# Patient Record
Sex: Female | Born: 1975 | Race: White | Hispanic: No | Marital: Married | Smoking: Never smoker
Health system: Southern US, Community
[De-identification: ages and names within clinical notes are randomized; demographics above are authoritative.]

---

## 2018-06-19 ENCOUNTER — Encounter: Payer: Self-pay | Admitting: Podiatry

## 2018-06-19 ENCOUNTER — Ambulatory Visit: Payer: BLUE CROSS/BLUE SHIELD | Admitting: Podiatry

## 2018-06-19 VITALS — BP 124/75 | HR 75 | Resp 16

## 2018-06-19 DIAGNOSIS — L600 Ingrowing nail: Secondary | ICD-10-CM | POA: Diagnosis not present

## 2018-06-19 MED ORDER — NEOMYCIN-POLYMYXIN-HC 1 % OT SOLN: OTIC | 1 refills | Status: AC

## 2018-06-19 NOTE — Patient Instructions (Signed)

## 2018-06-19 NOTE — Progress Notes (Signed)
  Subjective:  Patient ID: Shannon Rivera, female    DOB: [birth year],  MRN: [id number] HPI Chief Complaint  Patient presents with  . Toe Pain    Hallux left - medial border, tedner x 6 months, red and swollen, tried trimming-just moved from [place]  . 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 New Patient (Initial Visit)    42 y.o. female presents with the above complaint.   ROS: Denies fever chills nausea vomiting muscle aches pains calf pain back pain chest pain shortness of breath.  No past medical history on file.   Current Outpatient Medications:  .  ALBUTEROL IN, Inhale into the lungs., Disp: , Rfl:  .  Fexofenadine HCl (ALLEGRA PO), Take by mouth., Disp: , Rfl:  .  montelukast (SINGULAIR) 10 MG tablet, Take 10 mg by mouth at bedtime., Disp: , Rfl:  .  NEOMYCIN-POLYMYXIN-HYDROCORTISONE (CORTISPORIN) 1 % SOLN OTIC solution, Apply 1-2 drops to toe BID after soaking, Disp: 10 mL, Rfl: 1  Allergies  Allergen Reactions  . Sulfa Antibiotics    Review of Systems Objective:   Vitals:   06/19/18 1021  BP: 124/75  Pulse: 75  Resp: 16    General: Well developed, nourished, in no acute distress, alert and oriented x3   Dermatological: Skin is warm, dry and supple bilateral. Nails x 10 are well maintained; remaining integument appears unremarkable at this time. There are no open sores, no preulcerative lesions, no rash or signs of infection present.  Sharp incurvated nail margin tibial border hallux left mild erythema minimal pain on palpation.  No purulence no malodor.  Vascular: Dorsalis Pedis artery and Posterior Tibial artery pedal pulses are 2/4 bilateral with immedate capillary fill time. Pedal hair growth present. No varicosities and no lower extremity edema present bilateral.   Neruologic: Grossly intact via light touch bilateral. Vibratory intact via tuning fork bilateral. Protective threshold with Semmes Wienstein monofilament intact to all pedal sites bilateral.  Patellar and Achilles deep tendon reflexes 2+ bilateral. No Babinski or clonus noted bilateral.   Musculoskeletal: No gross boney pedal deformities bilateral. No pain, crepitus, or limitation noted with foot and ankle range of motion bilateral. Muscular strength 5/5 in all groups tested bilateral.  Gait: Unassisted, Nonantalgic.    Radiographs:  None taken  Assessment & Plan:   Assessment: Ingrown toenail tibial border hallux left.   Plan: Discussed etiology pathology conservative surgical therapies at this point in time chemical matricectomy was performed under local anesthesia was administered to the hallux left.  She tolerated procedure well without complications was provided with both oral and written home-going instruction for care and soaking of the toe as well as prescription for Cortisporin Otic.  Follow-up with her in 2 weeks she will call with questions or concerns.     Shannon Rivera T. [place], [place]

## 2018-07-03 ENCOUNTER — Ambulatory Visit (INDEPENDENT_AMBULATORY_CARE_PROVIDER_SITE_OTHER): Payer: Self-pay

## 2018-07-03 DIAGNOSIS — L600 Ingrowing nail: Secondary | ICD-10-CM

## 2018-07-03 NOTE — Progress Notes (Signed)
Patient is here today for follow-up appointment, recent procedure performed on 06/19/2018, removal of ingrown toenail left hallux tibial border.  She states that the area is not hurting or not causing her any complications.  No redness, no erythema, no swelling, no drainage, no other signs and symptoms of infection.  The area is scabbed over and is healing well.  Discussed signs and symptoms of infection.  Verbal and written instructions were given to the patient.  She is to follow-up as needed with any acute symptom changes.

## 2018-07-03 NOTE — Patient Instructions (Signed)

## 2018-10-19 ENCOUNTER — Other Ambulatory Visit: Payer: Self-pay | Admitting: Family Medicine

## 2018-10-19 DIAGNOSIS — Z1231 Encounter for screening mammogram for malignant neoplasm of breast: Secondary | ICD-10-CM

## 2018-12-10 ENCOUNTER — Other Ambulatory Visit: Payer: Self-pay | Admitting: Family Medicine

## 2019-04-09 ENCOUNTER — Ambulatory Visit: Payer: Self-pay

## 2019-04-15 ENCOUNTER — Ambulatory Visit: Payer: BLUE CROSS/BLUE SHIELD

## 2019-05-22 ENCOUNTER — Ambulatory Visit: Payer: Self-pay

## 2019-06-06 ENCOUNTER — Other Ambulatory Visit: Payer: Self-pay | Admitting: Radiology

## 2019-06-06 DIAGNOSIS — R9230 Dense breasts, unspecified: Secondary | ICD-10-CM

## 2019-06-06 DIAGNOSIS — R922 Inconclusive mammogram: Secondary | ICD-10-CM

## 2019-08-05 ENCOUNTER — Ambulatory Visit
Admission: RE | Admit: 2019-08-05 | Discharge: 2019-08-05 | Disposition: A | Discharge status: Home / Self Care | Payer: No Typology Code available for payment source | Source: Ambulatory Visit | Attending: Radiology | Admitting: Radiology

## 2019-08-05 DIAGNOSIS — R9230 Dense breasts, unspecified: Secondary | ICD-10-CM

## 2019-08-05 DIAGNOSIS — R922 Inconclusive mammogram: Secondary | ICD-10-CM

## 2019-08-05 MED ORDER — GADOBUTROL 1 MMOL/ML IV SOLN
8.0000 mL | Freq: Once | INTRAVENOUS | Status: AC | PRN
  Administered 2019-08-05: 8 mL via INTRAVENOUS

## 2020-09-25 ENCOUNTER — Other Ambulatory Visit: Payer: Self-pay

## 2020-09-25 ENCOUNTER — Other Ambulatory Visit (INDEPENDENT_AMBULATORY_CARE_PROVIDER_SITE_OTHER): Payer: Self-pay

## 2020-09-25 ENCOUNTER — Encounter (INDEPENDENT_AMBULATORY_CARE_PROVIDER_SITE_OTHER): Payer: Self-pay | Admitting: Otolaryngology

## 2020-09-25 ENCOUNTER — Ambulatory Visit (INDEPENDENT_AMBULATORY_CARE_PROVIDER_SITE_OTHER): Payer: BC Managed Care – PPO | Admitting: Otolaryngology

## 2020-09-25 VITALS — Temp 98.1°F

## 2020-09-25 DIAGNOSIS — J310 Chronic rhinitis: Secondary | ICD-10-CM

## 2020-09-25 MED ORDER — MUPIROCIN CALCIUM 2 % NA OINT: 1.0000 "application " | TOPICAL_OINTMENT | Freq: Two times a day (BID) | NASAL | 1 refills | Status: AC

## 2020-09-25 MED ORDER — DOXYCYCLINE HYCLATE 100 MG PO TABS: 100.0000 mg | ORAL_TABLET | Freq: Two times a day (BID) | ORAL | 0 refills | Status: AC

## 2020-09-25 NOTE — Progress Notes (Signed)
HPI: Shannon Rivera is a 45 y.o. female who presents is referred by her PCP Dr. Duaine Dredge for evaluation of left nasal symptoms.  This developed rather suddenly about a month ago when she woke up 1 morning with an obstructed left nostril and a burning sensation in the left nostril.  She has been treated with steroids as well as Augmentin and still having some burning sensation within the nasal cavity more on the left side.  She has had a history of sinus infections in the past.  She moved here from [place] and apparently had surgery on her sinuses performed in [place] in 2016.  She is breathing better today but still has a burning sensation within the nasal cavity and points to the side of the nose on the left side.  She has used saline irrigations as well as Flonase.Marland Kitchen  No past medical history on file.  Social History   Socioeconomic History  . Marital status: Married    Spouse name: Not on file  . Number of children: Not on file  . Years of education: Not on file  . Highest education level: Not on file  Occupational History  . Not on file  Tobacco Use  . Smoking status: Never Smoker  . Smokeless tobacco: Never Used  Substance and Sexual Activity  . Alcohol use: Not Currently  . Drug use: Not on file  . Sexual activity: Not on file  Other Topics Concern  . Not on file  Social History Narrative  . Not on file   Social Determinants of Health   Financial Resource Strain: Not on file  Food Insecurity: Not on file  Transportation Needs: Not on file  Physical Activity: Not on file  Stress: Not on file  Social Connections: Not on file   No family history on file. Allergies  Allergen Reactions  . Sulfa Antibiotics    Prior to Admission medications   Medication Sig Start Date End Date Taking? Authorizing Provider  ALBUTEROL IN Inhale into the lungs.    [provider]  doxycycline (VIBRA-TABS) 100 MG tablet Take 1 tablet (100 mg total) by mouth 2 (two) times daily.  09/25/20   Drema Halon, MD  Fexofenadine HCl (ALLEGRA PO) Take by mouth.    [provider]  montelukast (SINGULAIR) 10 MG tablet Take 10 mg by mouth at bedtime.    [provider]  mupirocin nasal ointment (BACTROBAN) 2 % Place 1 application into the nose 2 (two) times daily. Use one-half of tube in each nostril twice daily for five (5) days. After application, press sides of nose together and gently massage. 09/25/20   Drema Halon, MD  NEOMYCIN-POLYMYXIN-HYDROCORTISONE (CORTISPORIN) 1 % SOLN OTIC solution Apply 1-2 drops to toe BID after soaking 06/19/18   Hyatt, Max T, DPM     Positive ROS: Otherwise negative  All other systems have been reviewed and were otherwise negative with the exception of those mentioned in the HPI and as above.  Physical Exam: Constitutional: Alert, well-appearing, no acute distress Ears: External ears without lesions or tenderness. Ear canals are clear bilaterally with intact, clear TMs.  Nasal: External nose without lesions. Septum is relatively midline..  She has mild erythema and rhinitis within the left nasal passageway along the septum as well as laterally on the mucosa of the lateral nasal wall more so on the left side.  On nasal endoscopy both middle meatus regions are widely patent and there is no mucopurulent discharge from the ethmoid  area or the maxillary ostia which appear clear.  The nasopharynx is clear. Oral: Lips and gums without lesions. Tongue and palate mucosa without lesions. Posterior oropharynx clear. Neck: No palpable adenopathy or masses Respiratory: Breathing comfortably  Skin: No facial/neck lesions or rash noted.  Procedures  Assessment: This appears to be more of an inflammatory change of the mucous membranes of the nasal cavity than actual sinus infection.  Probably staph related.  Plan: Prescribed doxycycline 100 mg twice daily for 10 days.  Also suggested using mupirocin 2% ointment and or saline  nasal rinses which she is used for years for the next 5days.  This should help reduce any of the inflammatory changes and help with the burning sensation.  Recommended use of the Flonase for any nasal congestion. She will notify us if she is still having problems after she completes the doxycycline.   Narda Bonds, MD   CC:

## 2020-10-07 ENCOUNTER — Telehealth (INDEPENDENT_AMBULATORY_CARE_PROVIDER_SITE_OTHER): Payer: Self-pay

## 2020-10-07 ENCOUNTER — Other Ambulatory Visit (INDEPENDENT_AMBULATORY_CARE_PROVIDER_SITE_OTHER): Payer: Self-pay

## 2020-10-07 DIAGNOSIS — J329 Chronic sinusitis, unspecified: Secondary | ICD-10-CM

## 2020-10-07 NOTE — Progress Notes (Signed)
t

## 2020-10-10 ENCOUNTER — Ambulatory Visit
Admission: RE | Admit: 2020-10-10 | Discharge: 2020-10-10 | Disposition: A | Discharge status: Home / Self Care | Payer: BC Managed Care – PPO | Source: Ambulatory Visit | Attending: Otolaryngology | Admitting: Otolaryngology

## 2020-10-10 ENCOUNTER — Other Ambulatory Visit: Payer: Self-pay

## 2020-10-10 DIAGNOSIS — J329 Chronic sinusitis, unspecified: Secondary | ICD-10-CM

## 2020-10-13 ENCOUNTER — Telehealth (INDEPENDENT_AMBULATORY_CARE_PROVIDER_SITE_OTHER): Payer: Self-pay | Admitting: Otolaryngology

## 2020-10-13 NOTE — Telephone Encounter (Signed)
Shannon Rivera called concerning results of her CT scan of the sinuses.  I have reviewed these and on review of the CT scan of the sinuses this showed clear paranasal sinuses with widely patent sinus ostia from previous surgery performed in [place].  No significant inflammatory changes noted within the nasal cavity. Discussed whether concerning use of the saline irrigation with mupirocin extend for 1 week and then just switch back to plain saline irrigation. Concerning use of the Flonase would not necessarily recommend use of this unless she is having nasal congestion or trouble breathing as the sinus ostia are widely patent. She will follow-up as needed.

## 2020-10-17 ENCOUNTER — Other Ambulatory Visit: Payer: BC Managed Care – PPO

## 2020-10-26 ENCOUNTER — Ambulatory Visit (INDEPENDENT_AMBULATORY_CARE_PROVIDER_SITE_OTHER): Payer: BC Managed Care – PPO | Admitting: Otolaryngology

## 2020-12-19 IMAGING — MR MR BREAST WO/W CM  BILAT
5 series · 31 of 48 positions shown · IV contrast (8ml gadavist)
Comparison: Previous exam(s).

CLINICAL DATA: Abbreviated Breast MRI for breast cancer screening.

LABS:  None performed on site.
EXAM:
BILATERAL ABBREVIATED BREAST MRI WITH AND WITHOUT CONTRAST
TECHNIQUE: Multiplanar, multisequence MR images of both breasts were obtained
prior to and following the intravenous administration of 8 ml of
Gadavist.
Three-dimensional MR images were rendered by post-processing of the
original MR data on an independent workstation. The
three-dimensional MR images were interpreted, and findings are
reported in the following complete MRI report for this study.

[Series 2: t2_tirm_tra ipat · axial · 3.0mm · 0.70mm/px · z∈[-87,+87]mm · 5 of 59 slices shown]
[im 1/59]
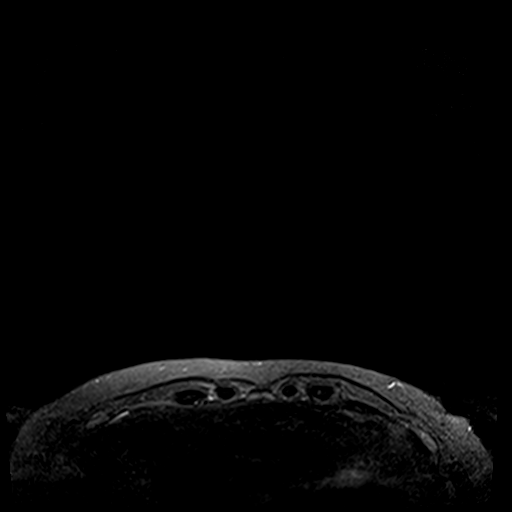
[im 15/59]
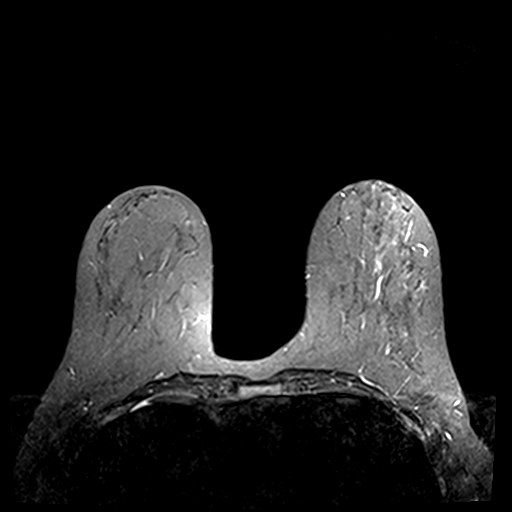
[im 30/59]
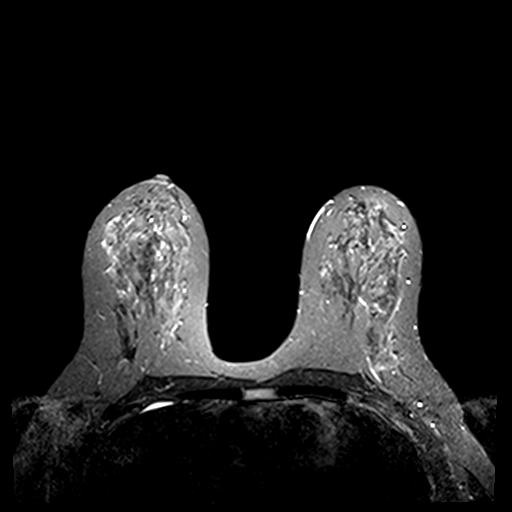
[im 44/59]
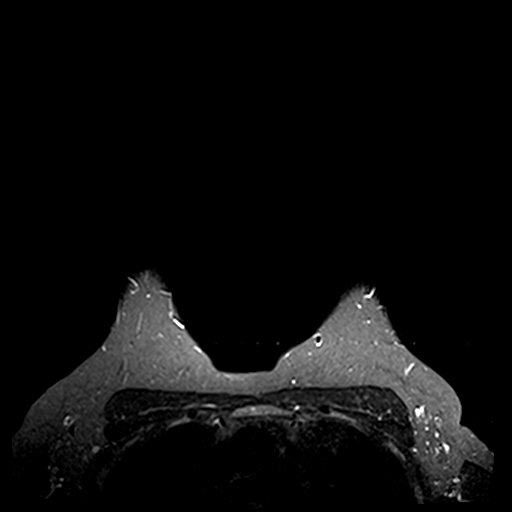
[im 59/59]
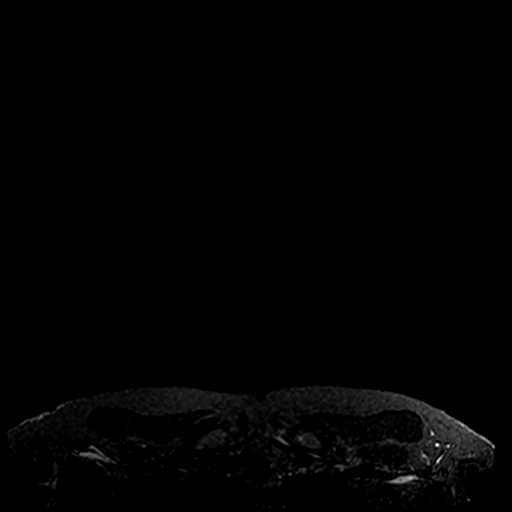

[Series 3: fl3d pre-cm · axial · non-contrast · 1.2mm · 0.94mm/px · z∈[-86,+85]mm · 9 of 144 slices shown]
[im 1/144]
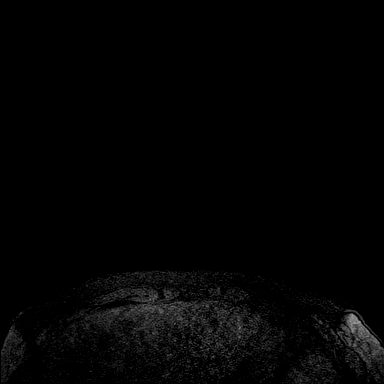
[im 12/144]
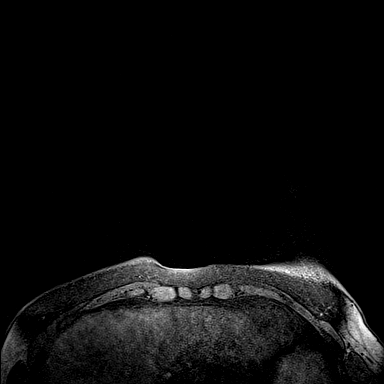
[im 23/144]
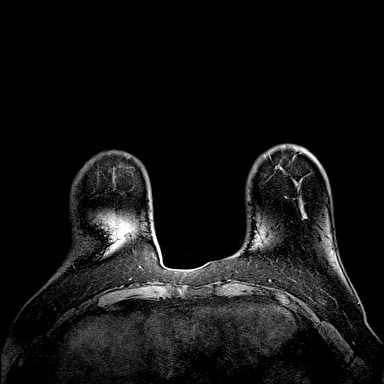
[im 45/144]
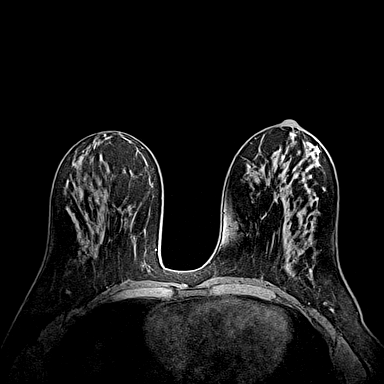
[im 67/144]
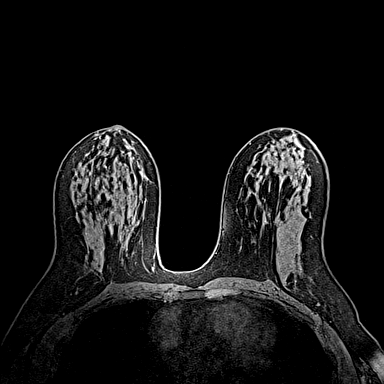
[im 78/144]
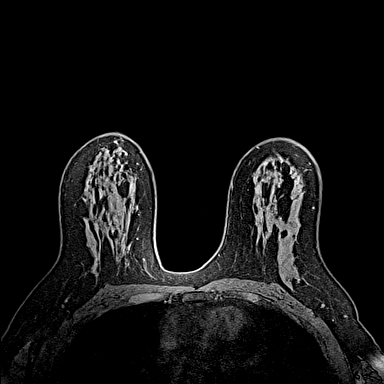
[im 100/144]
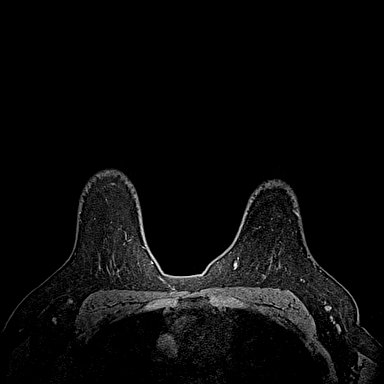
[im 122/144]
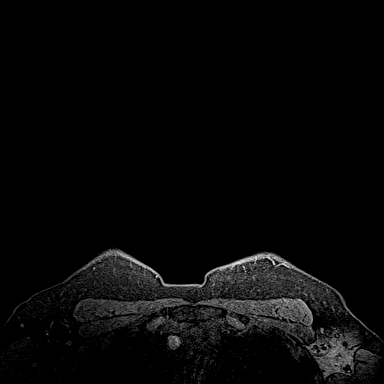
[im 144/144]
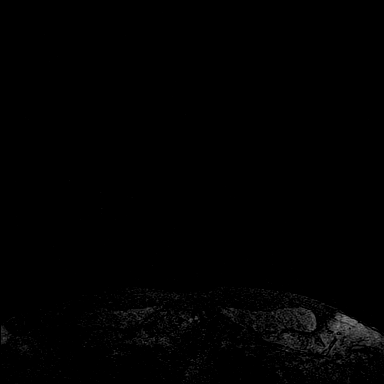

[Series 4: fl3d post-cm 20 · axial · 1.2mm · 0.94mm/px · z∈[-86,+85]mm · 8 of 144 slices shown (1 of 3)]
[im 1/144]
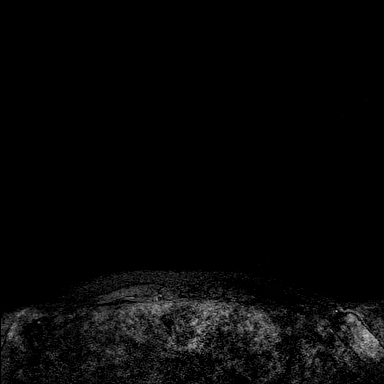
[im 23/144]
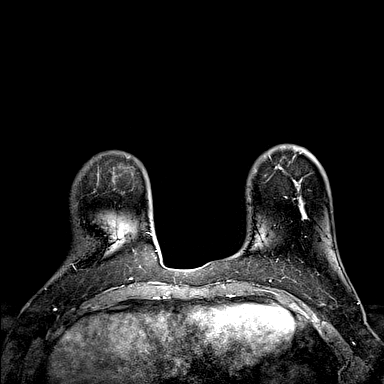
[im 45/144]
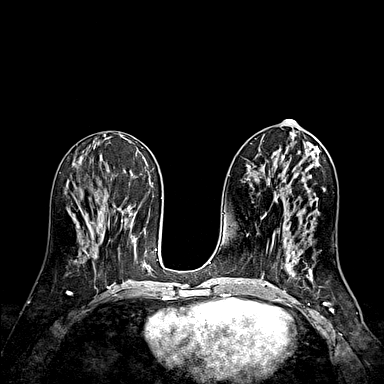
[im 67/144]
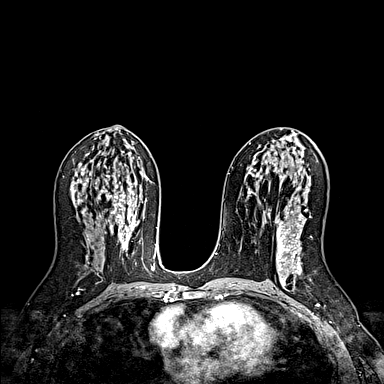
[im 78/144]
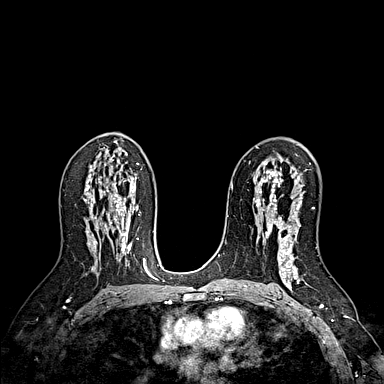
[im 100/144]
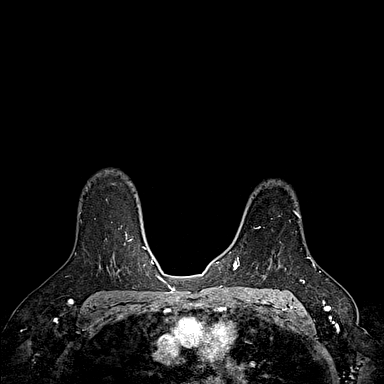
[im 122/144]
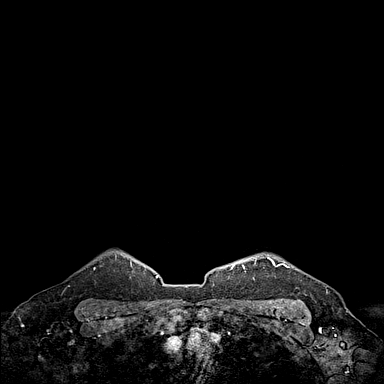
[im 144/144]
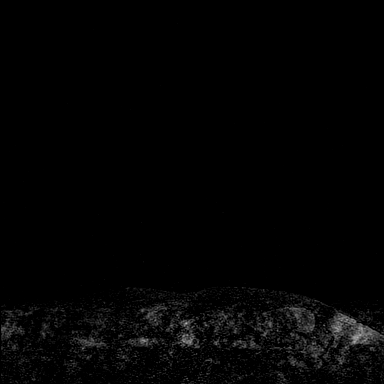

[Series 5: fl3d post-cm 20 · axial · 1.2mm · 0.94mm/px · z∈[-86,+85]mm · 8 of 144 slices shown (2 of 3)]
[im 1/144]
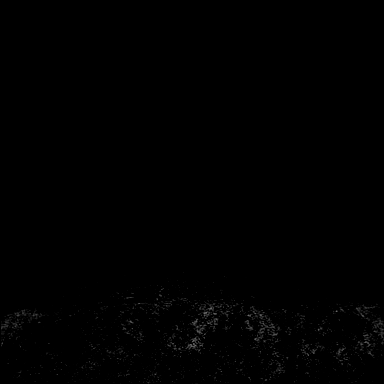
[im 23/144]
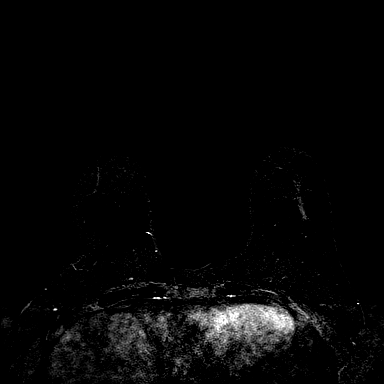
[im 45/144]
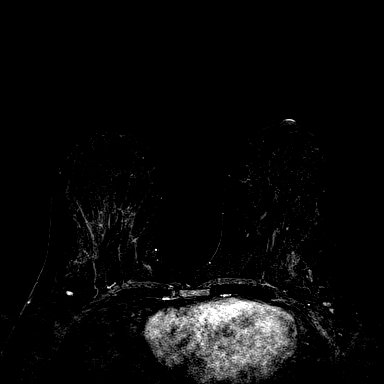
[im 67/144]
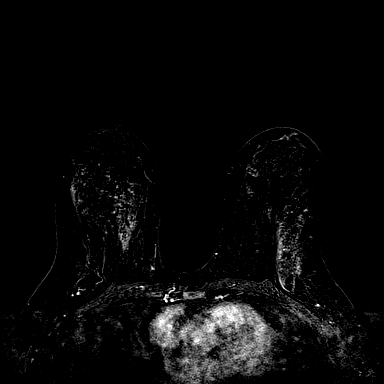
[im 78/144]
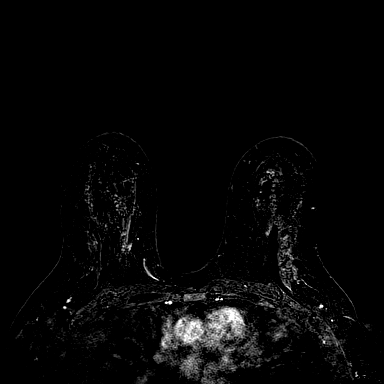
[im 100/144]
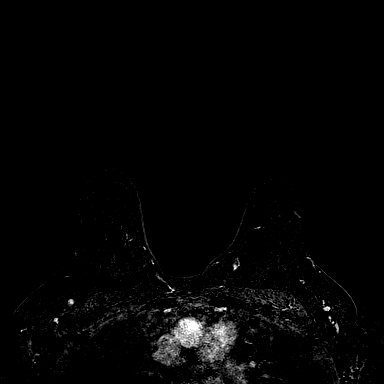
[im 122/144]
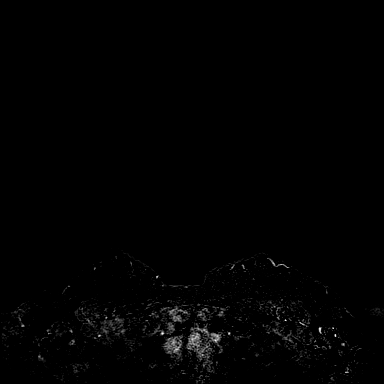
[im 144/144]
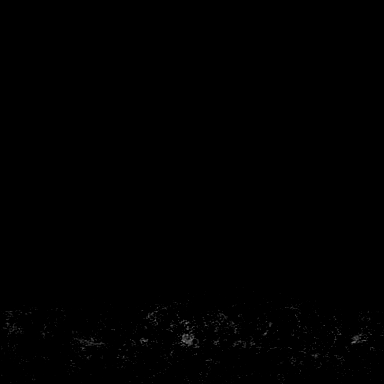

[Series 6: fl3d post-cm 20 · axial · 172.8mm · 0.94mm/px · 1 of 1 slices shown (3 of 3)]
[im 1/1]
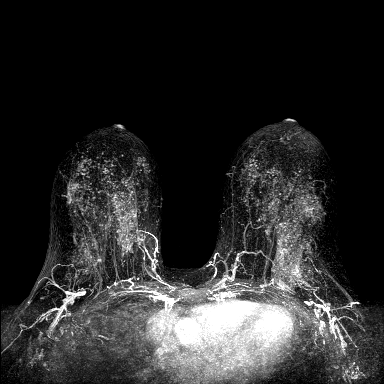

[31 of 48 positions shown; findings below may reference images not displayed]

FINDINGS: Breast composition: c. Heterogeneous fibroglandular tissue.

Background parenchymal enhancement: Moderate to marked.

Right breast: No mass or abnormal enhancement.

Left breast: No mass or abnormal enhancement.

Lymph nodes: No abnormal appearing lymph nodes.

Ancillary findings:  None.
IMPRESSION: No MRI evidence of malignancy.

RECOMMENDATION:
Recommend annual screening mammography. The patient is also eligible
for annual Abbreviated Breast MRI if she wishes

BI-RADS CATEGORY  1: Negative.

## 2024-05-15 NOTE — Telephone Encounter (Signed)
 Pt called in and stated she was seen yesterday for conjunctivitis of eyes and that the provider suggested she follow up with us . Also her sinus are some better but not fully. Per Dr. Ethyl I will order a ct scan of sinuses and have pt to follow up after. I did cal pt and informed her.
